# Patient Record
Sex: Male | Born: 2000 | Race: White | Hispanic: No | Marital: Single | Smoking: Never smoker
Health system: Southern US, Community
[De-identification: ages and names within clinical notes are randomized; demographics above are authoritative.]

## PROBLEM LIST (undated history)

## (undated) DIAGNOSIS — F909 Attention-deficit hyperactivity disorder, unspecified type: Secondary | ICD-10-CM

## (undated) DIAGNOSIS — R1115 Cyclical vomiting syndrome unrelated to migraine: Secondary | ICD-10-CM

## (undated) DIAGNOSIS — G43D0 Abdominal migraine, not intractable: Secondary | ICD-10-CM

## (undated) DIAGNOSIS — T7840XA Allergy, unspecified, initial encounter: Secondary | ICD-10-CM

---
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
## 2021-06-03 ENCOUNTER — Other Ambulatory Visit: Payer: Self-pay

## 2021-06-03 ENCOUNTER — Encounter: Payer: Self-pay | Admitting: Emergency Medicine

## 2021-06-03 ENCOUNTER — Ambulatory Visit
Admission: EM | Admit: 2021-06-03 | Discharge: 2021-06-03 | Disposition: A | Discharge status: Home / Self Care | Payer: BC Managed Care – PPO | Attending: Emergency Medicine | Admitting: Emergency Medicine

## 2021-06-03 DIAGNOSIS — J101 Influenza due to other identified influenza virus with other respiratory manifestations: Secondary | ICD-10-CM

## 2021-06-03 DIAGNOSIS — R112 Nausea with vomiting, unspecified: Secondary | ICD-10-CM

## 2021-06-03 LAB — POCT INFLUENZA A/B
Influenza A, POC: POSITIVE — AB
Influenza B, POC: NEGATIVE

## 2021-06-03 MED ORDER — ONDANSETRON HCL 4 MG/2ML IJ SOLN: 4.0000 mg | Freq: Once | INTRAMUSCULAR | Status: DC

## 2021-06-03 MED ORDER — OSELTAMIVIR PHOSPHATE 75 MG PO CAPS: 75.0000 mg | ORAL_CAPSULE | Freq: Two times a day (BID) | ORAL | 0 refills | Status: DC

## 2021-06-03 MED ORDER — ONDANSETRON 4 MG PO TBDP: 4.0000 mg | ORAL_TABLET | Freq: Three times a day (TID) | ORAL | 0 refills | Status: DC | PRN

## 2021-06-03 MED ORDER — ONDANSETRON 4 MG PO TBDP
4.0000 mg | ORAL_TABLET | Freq: Once | ORAL | Status: AC
  Administered 2021-06-03: 4 mg via ORAL

## 2021-06-03 NOTE — Discharge Instructions (Addendum)
Take the Tamiflu as directed.  Take Tylenol or ibuprofen as needed for fever or discomfort.    Take the antinausea medication as directed.    Keep yourself hydrated with clear liquids, such as water, Gatorade, Pedialyte, Sprite, or ginger ale.    Follow up with your primary care provider if your symptoms are not improving.

## 2021-06-03 NOTE — ED Provider Notes (Signed)
Christopher Myers    CSN: [id number] Arrival date & time: 06/03/21  1157      History   Chief Complaint Chief Complaint  Patient presents with   Fever   Emesis   Generalized Body Aches    HPI Christopher Myers is a 20 y.o. male.  Patient presents with fever, body aches, nausea, vomiting since last night.  Treatment at home with ibuprofen; last dose taken this morning.  He denies cough, shortness of breath, diarrhea, or other symptoms.  His medical history includes abdominal migraines, cyclical vomiting syndrome, allergies, ADHD.  The history is provided by the patient, a parent and medical records.   Past Medical History:  Diagnosis Date   Abdominal migraine    ADHD (attention deficit hyperactivity disorder)    Allergy    Cyclic vomiting syndrome     There are no problems to display for this patient.   Past Surgical History:  Procedure Laterality Date   FOREARM FRACTURE SURGERY Right 11/2015   HARDWARE REMOVAL Right 07/23/2016   Procedure: RIGHT FOREARM REMOVAL OF DEEP IMPLANTS RADIUS AND ULNAR;  Surgeon: Bradly Bienenstock, MD;  Location: [facility];  Service: Orthopedics;  Laterality: Right;       Home Medications    Prior to Admission medications   Medication Sig Start Date End Date Taking? Authorizing Provider  ondansetron (ZOFRAN ODT) 4 MG disintegrating tablet Take 1 tablet (4 mg total) by mouth every 8 (eight) hours as needed for nausea or vomiting. 06/03/21  Yes Mickie Bail, NP  oseltamivir (TAMIFLU) 75 MG capsule Take 1 capsule (75 mg total) by mouth every 12 (twelve) hours. 06/03/21  Yes Mickie Bail, NP  cholecalciferol (VITAMIN D) 1000 units tablet Take 1,000 Units by mouth daily.    [provider]  Coenzyme Q10 (COQ10) 200 MG CAPS Take 200 mg by mouth daily.    [provider]  Dexmethylphenidate HCl 30 MG CP24 Take 30 mg by mouth daily.  05/11/16   [provider]  ipratropium (ATROVENT) 0.06 % nasal spray Place 1 spray into  both nostrils daily. 06/30/16   [provider]  levocetirizine (XYZAL) 5 MG tablet Take 5 mg by mouth every evening.  05/12/16   [provider]  Methylphenidate HCl (CONCERTA PO) Take 30 mg by mouth.    [provider]  montelukast (SINGULAIR) 10 MG tablet Take 10 mg by mouth at bedtime.    [provider]  OVER THE COUNTER MEDICATION Take 1 tablet by mouth 2 (two) times daily. L-Cartine    [provider]    Family History Family History  Problem Relation Age of Onset   Migraines Mother     Social History Social History   Tobacco Use   Smoking status: Never   Smokeless tobacco: Never     Allergies   Fish allergy   Review of Systems Review of Systems  Constitutional:  Positive for fever. Negative for chills.  HENT:  Negative for ear pain and sore throat.   Respiratory:  Negative for cough and shortness of breath.   Cardiovascular:  Negative for chest pain and palpitations.  Gastrointestinal:  Positive for nausea and vomiting. Negative for abdominal pain, constipation and diarrhea.  Skin:  Negative for color change and rash.  All other systems reviewed and are negative.   Physical Exam Triage Vital Signs ED Triage Vitals [06/03/21 0859]  Enc Vitals Group     BP      Pulse  Resp      Temp      Temp src      SpO2      Weight      Height      Head Circumference      Peak Flow      Pain Score 7     Pain Loc      Pain Edu?      Excl. in GC?    No data found.  Updated Vital Signs BP 118/65   Pulse 78   Temp 99.3 F (37.4 C) (Oral)   Resp 20   SpO2 100%   Visual Acuity Right Eye Distance:   Left Eye Distance:   Bilateral Distance:    Right Eye Near:   Left Eye Near:    Bilateral Near:     Physical Exam Vitals and nursing note reviewed.  Constitutional:      General: He is not in acute distress.    Appearance: He is well-developed.  HENT:     Head: Normocephalic and atraumatic.     Right Ear:  Tympanic membrane normal.     Left Ear: Tympanic membrane normal.     Nose: Nose normal.     Mouth/Throat:     Mouth: Mucous membranes are moist.     Pharynx: Oropharynx is clear.  Eyes:     Conjunctiva/sclera: Conjunctivae normal.  Cardiovascular:     Rate and Rhythm: Normal rate and regular rhythm.     Heart sounds: Normal heart sounds.  Pulmonary:     Effort: Pulmonary effort is normal. No respiratory distress.     Breath sounds: Normal breath sounds.  Abdominal:     General: Bowel sounds are normal.     Palpations: Abdomen is soft.     Tenderness: There is no abdominal tenderness. There is no guarding or rebound.  Musculoskeletal:     Cervical back: Neck supple.  Skin:    General: Skin is warm and dry.  Neurological:     Mental Status: He is alert.  Psychiatric:        Mood and Affect: Mood normal.        Behavior: Behavior normal.     UC Treatments / Results  Labs (all labs ordered are listed, but only abnormal results are displayed) Labs Reviewed  POCT INFLUENZA A/B - Abnormal; Notable for the following components:      Result Value   Influenza A, POC Positive (*)    All other components within normal limits    EKG   Radiology No results found.  Procedures Procedures (including critical care time)  Medications Ordered in UC Medications  ondansetron (ZOFRAN-ODT) disintegrating tablet 4 mg (4 mg Oral Given 06/03/21 0913)    Initial Impression / Assessment and Plan / UC Course  I have reviewed the triage vital signs and the nursing notes.  Pertinent labs & imaging results that were available during my care of the patient were reviewed by me and considered in my medical decision making (see chart for details).   Influenza A. Nausea and vomiting.   Rapid flu test positive for influenza A.  Treating with Tamiflu.  Discussed symptomatic treatment including Tylenol or ibuprofen as needed.  Education provided on influenza.  Treating nausea and vomiting with  Zofran.  Instructed patient to stay hydrated with clear liquids.  Education provided on nausea and vomiting.  Instructed patient to follow-up with PCP if his symptoms are not improving.  He agrees to plan of  care.    Final Clinical Impressions(s) / UC Diagnoses   Final diagnoses:  Influenza A  Nausea and vomiting, unspecified vomiting type     Discharge Instructions      Take the Tamiflu as directed.  Take Tylenol or ibuprofen as needed for fever or discomfort.    Take the antinausea medication as directed.    Keep yourself hydrated with clear liquids, such as water, Gatorade, Pedialyte, Sprite, or ginger ale.    Follow up with your primary care provider if your symptoms are not improving.           ED Prescriptions     Medication Sig Dispense Auth. Provider   ondansetron (ZOFRAN ODT) 4 MG disintegrating tablet Take 1 tablet (4 mg total) by mouth every 8 (eight) hours as needed for nausea or vomiting. 20 tablet Sharion Balloon, NP   oseltamivir (TAMIFLU) 75 MG capsule Take 1 capsule (75 mg total) by mouth every 12 (twelve) hours. 10 capsule Sharion Balloon, NP      PDMP not reviewed this encounter.   Sharion Balloon, NP 06/03/21 [phone]

## 2021-06-03 NOTE — ED Triage Notes (Signed)
Pt here with fever, vomiting, and body aches since last night.

## 2021-06-09 ENCOUNTER — Encounter: Payer: Self-pay | Admitting: Emergency Medicine

## 2021-06-09 ENCOUNTER — Other Ambulatory Visit: Payer: Self-pay

## 2021-06-09 ENCOUNTER — Ambulatory Visit
Admission: EM | Admit: 2021-06-09 | Discharge: 2021-06-09 | Disposition: A | Discharge status: Home / Self Care | Payer: BC Managed Care – PPO | Attending: Emergency Medicine | Admitting: Emergency Medicine

## 2021-06-09 DIAGNOSIS — R051 Acute cough: Secondary | ICD-10-CM | POA: Diagnosis not present

## 2021-06-09 DIAGNOSIS — R058 Other specified cough: Secondary | ICD-10-CM

## 2021-06-09 MED ORDER — BENZONATATE 100 MG PO CAPS: 100.0000 mg | ORAL_CAPSULE | Freq: Three times a day (TID) | ORAL | 0 refills | Status: DC

## 2021-06-09 NOTE — Discharge Instructions (Addendum)
Take Tessalon perles as prescribed  Most people will have symptoms for about 7 - 10 days. A cough might linger for 2-3 weeks. Pay special attention to handwashing as this can prevent spread of the virus.   Always read the labels of cough and cold medications as they may contain some of the ingredients below.  Rest, push lots of fluids (especially water), and utilize supportive care for symptoms. You may take acetaminophen (Tylenol) every 4-6 hours and ibuprofen every 6-8 hours for muscle pain, joint pain, headaches (you may also alternate these medications). Mucinex (guaifenesin) may be taken over the counter for cough as needed can loosen phlegm. Please read the instructions and take as directed.  Saline nasal sprays or rinses can also help nasal congestion (use bottled or sterile water). Warm tea with lemon and honey can sooth sore throat and cough, as can cough drops.   Return to clinic for high fever not improving with medications, chest pain, difficulty breathing, non-stop vomiting, or coughing blood. Follow-up with your primary care provider if symptoms do not improve as expected in the next 5-7 days.

## 2021-06-09 NOTE — ED Triage Notes (Signed)
Pt here flu positive with persistent cough x 1 week.

## 2021-06-09 NOTE — ED Provider Notes (Signed)
CHIEF COMPLAINT:   Chief Complaint  Patient presents with   Cough   Influenza     SUBJECTIVE/HPI:   Cough Influenza Presenting symptoms: cough   A very pleasant 20 y.o.Male presents today with influenza and continued cough x 1 week. Patient does not report any shortness of breath, chest pain, palpitations, visual changes, weakness, tingling, headache, nausea, vomiting, diarrhea, fever, chills.   has a past medical history of Abdominal migraine, ADHD (attention deficit hyperactivity disorder), Allergy, and Cyclic vomiting syndrome.  ROS:  Review of Systems  Respiratory:  Positive for cough.   See Subjective/HPI Medications, Allergies and Problem List personally reviewed in Epic today OBJECTIVE:   Vitals:   06/09/21 0814  BP: 134/82  Pulse: 68  Resp: 20  Temp: 98.9 F (37.2 C)  SpO2: 98%    Physical Exam   General: Appears well-developed and well-nourished. No acute distress.  HEENT Head: Normocephalic and atraumatic.   Ears: Hearing grossly intact, no drainage or visible deformity.  Nose: No nasal deviation.   Mouth/Throat: No stridor or tracheal deviation.  Non erythematous posterior pharynx noted with clear drainage present.  No white patchy exudate noted. Eyes: Conjunctivae and EOM are normal. No eye drainage or scleral icterus bilaterally.  Neck: Normal range of motion, neck is supple.  Cardiovascular: Normal rate. Regular rhythm; no murmurs, gallops, or rubs.  Pulm/Chest: No respiratory distress. Breath sounds normal bilaterally without wheezes, rhonchi, or rales.  Neurological: Alert and oriented to person, place, and time.  Skin: Skin is warm and dry.  No rashes, lesions, abrasions or bruising noted to skin.   Psychiatric: Normal mood, affect, behavior, and thought content.   Vital signs and nursing note reviewed.   Patient stable and cooperative with examination. PROCEDURES:    LABS/X-RAYS/EKG/MEDS:   No results found for any visits on  06/09/21.  MEDICAL DECISION MAKING:   Patient presents with influenza and continued cough x 1 week. Patient does not report any shortness of breath, chest pain, palpitations, visual changes, weakness, tingling, headache, nausea, vomiting, diarrhea, fever, chills. Likely post-viral cough. Rx [employer]. Advised rest, fluids, Tylenol, ibuprofen.  Follow-up with PCP if not improving over the next 5 to 7 days.  Return for any new high fever not improved with medications, chest pain, difficulty breathing, nonstop vomiting or coughing up blood.   Patient verbalized understanding and agreed with treatment plan.  Patient stable upon discharge. ASSESSMENT/PLAN:  1. Post-viral cough syndrome - benzonatate (TESSALON) 100 MG capsule; Take 1 capsule (100 mg total) by mouth every 8 (eight) hours.  Dispense: 21 capsule; Refill: 0 Instructions about new medications and side effects provided.  Plan:   Discharge Instructions      Take Tessalon perles as prescribed  Most people will have symptoms for about 7 - 10 days. A cough might linger for 2-3 weeks. Pay special attention to handwashing as this can prevent spread of the virus.   Always read the labels of cough and cold medications as they may contain some of the ingredients below.  Rest, push lots of fluids (especially water), and utilize supportive care for symptoms. You may take acetaminophen (Tylenol) every 4-6 hours and ibuprofen every 6-8 hours for muscle pain, joint pain, headaches (you may also alternate these medications). Mucinex (guaifenesin) may be taken over the counter for cough as needed can loosen phlegm. Please read the instructions and take as directed.  Saline nasal sprays or rinses can also help nasal congestion (use bottled or sterile water). Warm tea with  lemon and honey can sooth sore throat and cough, as can cough drops.   Return to clinic for high fever not improving with medications, chest pain, difficulty breathing,  non-stop vomiting, or coughing blood. Follow-up with your primary care provider if symptoms do not improve as expected in the next 5-7 days.          Amalia Greenhouse, [place] 06/09/21 [phone]

## 2021-07-07 ENCOUNTER — Ambulatory Visit
Admission: RE | Admit: 2021-07-07 | Discharge: 2021-07-07 | Disposition: A | Discharge status: Home / Self Care | Payer: BC Managed Care – PPO | Source: Ambulatory Visit | Attending: Medical Oncology | Admitting: Medical Oncology

## 2021-07-07 ENCOUNTER — Other Ambulatory Visit: Payer: Self-pay

## 2021-07-07 ENCOUNTER — Telehealth: Payer: Self-pay | Admitting: Medical Oncology

## 2021-07-07 VITALS — BP 112/71 | HR 102 | Temp 98.7°F | Resp 16

## 2021-07-07 DIAGNOSIS — B349 Viral infection, unspecified: Secondary | ICD-10-CM | POA: Insufficient documentation

## 2021-07-07 DIAGNOSIS — R058 Other specified cough: Secondary | ICD-10-CM

## 2021-07-07 DIAGNOSIS — J029 Acute pharyngitis, unspecified: Secondary | ICD-10-CM | POA: Insufficient documentation

## 2021-07-07 LAB — POCT RAPID STREP A (OFFICE): Rapid Strep A Screen: NEGATIVE

## 2021-07-07 MED ORDER — BENZONATATE 100 MG PO CAPS: 100.0000 mg | ORAL_CAPSULE | Freq: Three times a day (TID) | ORAL | 0 refills | Status: DC

## 2021-07-07 NOTE — ED Triage Notes (Signed)
Pt here with sore throat, nasal congestion, fever at home, and slight cough x 2 days. Pt works at [employer].

## 2021-07-07 NOTE — Telephone Encounter (Signed)
Error

## 2021-07-07 NOTE — ED Provider Notes (Signed)
[facility]    CSN: [id number] Arrival date & time: 07/07/21  1154      History   Chief Complaint Cold Symptoms    HPI Christopher Myers is a 20 y.o. male. Pt presents with his mother  HPI  Cold Symptoms: Pt states that he has had cold like symptoms of nasal congestion, sore throat, fever and mild cough for the past 2 days. He reports that one month ago he had the flu but fully resolved. 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 He denies SOB, wheezing, vomiting. He has tried tylenol, sore throat lozenges and sprays for symptoms with some relief.   Past Medical History:  Diagnosis Date   Abdominal migraine    ADHD (attention deficit hyperactivity disorder)    Allergy    Cyclic vomiting syndrome     There are no problems to display for this patient.   Past Surgical History:  Procedure Laterality Date   FOREARM FRACTURE SURGERY Right 11/2015   HARDWARE REMOVAL Right 07/23/2016   Procedure: RIGHT FOREARM REMOVAL OF DEEP IMPLANTS RADIUS AND ULNAR;  Surgeon: Iran Planas, MD;  Location: [facility];  Service: Orthopedics;  Laterality: Right;       Home Medications    Prior to Admission medications   Medication Sig Start Date End Date Taking? Authorizing Provider  benzonatate (TESSALON) 100 MG capsule Take 1 capsule (100 mg total) by mouth every 8 (eight) hours. 06/09/21   Boddu, Erasmo Downer, FNP  cholecalciferol (VITAMIN D) 1000 units tablet Take 1,000 Units by mouth daily.    [provider]  Coenzyme Q10 (COQ10) 200 MG CAPS Take 200 mg by mouth daily.    [provider]  Dexmethylphenidate HCl 30 MG CP24 Take 30 mg by mouth daily.  05/11/16   [provider]  ipratropium (ATROVENT) 0.06 % nasal spray Place 1 spray into both nostrils daily. 06/30/16   [provider]  levocetirizine (XYZAL) 5 MG tablet Take 5 mg by mouth every evening.  05/12/16   [provider]  Methylphenidate  HCl (CONCERTA PO) Take 30 mg by mouth.    [provider]  montelukast (SINGULAIR) 10 MG tablet Take 10 mg by mouth at bedtime.    [provider]  ondansetron (ZOFRAN ODT) 4 MG disintegrating tablet Take 1 tablet (4 mg total) by mouth every 8 (eight) hours as needed for nausea or vomiting. 06/03/21   Sharion Balloon, NP  oseltamivir (TAMIFLU) 75 MG capsule Take 1 capsule (75 mg total) by mouth every 12 (twelve) hours. 06/03/21   Sharion Balloon, NP  OVER THE COUNTER MEDICATION Take 1 tablet by mouth 2 (two) times daily. L-Cartine    [provider]    Family History Family History  Problem Relation Age of Onset   Migraines Mother     Social History Social History   Tobacco Use   Smoking status: Never   Smokeless tobacco: Never     Allergies   Fish allergy   Review of Systems Review of Systems  As stated above in HPI Physical Exam Triage Vital Signs ED Triage Vitals  Enc Vitals Group     BP      Pulse      Resp      Temp      Temp src      SpO2      Weight      Height  Head Circumference      Peak Flow      Pain Score      Pain Loc      Pain Edu?      Excl. in GC?    No data found.  Updated Vital Signs BP 112/71    Pulse (!) 102    Temp 98.7 F (37.1 C) (Oral)    Resp 16    SpO2 100%   Physical Exam Vitals and nursing note reviewed.  Constitutional:      General: He is not in acute distress.    Appearance: Normal appearance. He is not ill-appearing, toxic-appearing or diaphoretic.  HENT:     Head: Normocephalic and atraumatic.     Right Ear: Tympanic membrane normal.     Left Ear: Tympanic membrane normal.     Nose: Nose normal.     Mouth/Throat:     Mouth: Mucous membranes are moist.     Pharynx: Posterior oropharyngeal erythema present. No oropharyngeal exudate.  Eyes:     Extraocular Movements: Extraocular movements intact.     Pupils: Pupils are equal, round, and reactive to light.  Cardiovascular:     Rate and  Rhythm: Normal rate and regular rhythm.     Heart sounds: Normal heart sounds.  Pulmonary:     Effort: Pulmonary effort is normal.     Breath sounds: Normal breath sounds.  Abdominal:     Palpations: Abdomen is soft.  Musculoskeletal:     Cervical back: Normal range of motion and neck supple.  Lymphadenopathy:     Cervical: No cervical adenopathy.  Skin:    General: Skin is warm.     Findings: No rash.  Neurological:     Mental Status: He is alert and oriented to person, place, and time.     UC Treatments / Results  Labs (all labs ordered are listed, but only abnormal results are displayed) Labs Reviewed  NOVEL CORONAVIRUS, NAA  POCT RAPID STREP A (OFFICE)    EKG   Radiology No results found.  Procedures Procedures (including critical care time)  Medications Ordered in UC Medications - No data to display  Initial Impression / Assessment and Plan / UC Course  I have reviewed the triage vital signs and the nursing notes.  Pertinent labs & imaging results that were available during my care of the patient were reviewed by me and considered in my medical decision making (see chart for details).     New. Likely viral in nature. Rapid strep is negative. COVID-19 testing pending. For now I have recommended tylenol or motrin, fluids, rest, sore throat lozenges/sprays and tessalon which I have sent to the pharmacy for him. Discussed red flag signs and symptoms. Follow up PRN.  Final Clinical Impressions(s) / UC Diagnoses   Final diagnoses:  Viral illness   Discharge Instructions   None    ED Prescriptions   None    PDMP not reviewed this encounter.   Rushie Chestnut, [place] 07/07/21 1250

## 2021-07-08 LAB — SARS-COV-2, NAA 2 DAY TAT

## 2021-07-08 LAB — NOVEL CORONAVIRUS, NAA: SARS-CoV-2, NAA: NOT DETECTED

## 2021-07-09 LAB — CULTURE, GROUP A STREP (THRC)

## 2021-09-14 ENCOUNTER — Telehealth: Payer: BC Managed Care – PPO | Admitting: Physician Assistant

## 2021-09-14 DIAGNOSIS — U071 COVID-19: Secondary | ICD-10-CM

## 2021-09-14 MED ORDER — NIRMATRELVIR/RITONAVIR (PAXLOVID)TABLET
3.0000 | ORAL_TABLET | Freq: Two times a day (BID) | ORAL | 0 refills | Status: AC
Start: 2021-09-14 — End: 2021-09-19

## 2021-09-14 NOTE — Progress Notes (Signed)
Virtual Visit Consent   Christopher Myers, you are scheduled for a virtual visit with a [facility] provider today.     Just as with appointments in the office, your consent must be obtained to participate.  Your consent will be active for this visit and any virtual visit you may have with one of our providers in the next 365 days.     If you have a MyChart account, a copy of this consent can be sent to you electronically.  All virtual visits are billed to your insurance company just like a traditional visit in the office.    As this is a virtual visit, video technology does not allow for your provider to perform a traditional examination.  This may limit your provider's ability to fully assess your condition.  If your provider identifies any concerns that need to be evaluated in person or the need to arrange testing (such as labs, EKG, etc.), we will make arrangements to do so.     Although advances in technology are sophisticated, we cannot ensure that it will always work on either your end or our end.  If the connection with a video visit is poor, the visit may have to be switched to a telephone visit.  With either a video or telephone visit, we are not always able to ensure that we have a secure connection.     I need to obtain your verbal consent now.   Are you willing to proceed with your visit today?    Christopher Myers has provided verbal consent on 09/14/2021 for a virtual visit (video or telephone).   Mar Daring, PA-C   Date: 09/14/2021 1:44 PM   Virtual Visit via Video Note   IMar Daring, connected with  Christopher Myers  ([id number], 2000/08/18) on 09/14/21 at  1:30 PM EST by a video-enabled telemedicine application and verified that I am speaking with the correct person using two identifiers.  Location: Patient: Virtual Visit Location Patient: Home Provider: Virtual Visit Location Provider: Home Office   I discussed the limitations of evaluation and  management by telemedicine and the availability of in person appointments. The patient expressed understanding and agreed to proceed.    History of Present Illness: Christopher Myers is a 21 y.o. who identifies as a male who was assigned male at birth, and is being seen today for Covid 39.  HPI: URI  This is a new problem. Episode onset: tested positive for covid 19 today; symptoms started last night. The problem has been gradually worsening. There has been no fever. Associated symptoms include congestion, coughing, headaches, rhinorrhea, sinus pain and a sore throat. Pertinent negatives include no diarrhea, ear pain, nausea, plugged ear sensation or vomiting. Associated symptoms comments: Lightheaded, fatigue. Treatments tried: ibuprofen. The treatment provided mild relief.     Problems: There are no problems to display for this patient.   Allergies:  Allergies  Allergen Reactions   Fish Allergy Other (See Comments)    unknown   Medications:  Current Outpatient Medications:    nirmatrelvir/ritonavir EUA (PAXLOVID) 20 x 150 MG & 10 x 100MG  TABS, Take 3 tablets by mouth 2 (two) times daily for 5 days. (Take nirmatrelvir 150 mg two tablets twice daily for 5 days and ritonavir 100 mg one tablet twice daily for 5 days), Disp: 30 tablet, Rfl: 0   benzonatate (TESSALON) 100 MG capsule, Take 1 capsule (100 mg total) by mouth every 8 (eight) hours., Disp: 21  capsule, Rfl: 0   cholecalciferol (VITAMIN D) 1000 units tablet, Take 1,000 Units by mouth daily., Disp: , Rfl:    Coenzyme Q10 (COQ10) 200 MG CAPS, Take 200 mg by mouth daily., Disp: , Rfl:    Dexmethylphenidate HCl 30 MG CP24, Take 30 mg by mouth daily. , Disp: , Rfl:    ipratropium (ATROVENT) 0.06 % nasal spray, Place 1 spray into both nostrils daily., Disp: , Rfl:    levocetirizine (XYZAL) 5 MG tablet, Take 5 mg by mouth every evening. , Disp: , Rfl:    Methylphenidate HCl (CONCERTA PO), Take 30 mg by mouth., Disp: , Rfl:    montelukast  (SINGULAIR) 10 MG tablet, Take 10 mg by mouth at bedtime., Disp: , Rfl:    ondansetron (ZOFRAN ODT) 4 MG disintegrating tablet, Take 1 tablet (4 mg total) by mouth every 8 (eight) hours as needed for nausea or vomiting., Disp: 20 tablet, Rfl: 0   oseltamivir (TAMIFLU) 75 MG capsule, Take 1 capsule (75 mg total) by mouth every 12 (twelve) hours., Disp: 10 capsule, Rfl: 0   OVER THE COUNTER MEDICATION, Take 1 tablet by mouth 2 (two) times daily. L-Cartine, Disp: , Rfl:   Observations/Objective: Patient is well-developed, well-nourished in no acute distress.  Resting comfortably at home.  Head is normocephalic, atraumatic.  No labored breathing.  Speech is clear and coherent with logical content.  Patient is alert and oriented at baseline.    Assessment and Plan: 1. COVID-19 - nirmatrelvir/ritonavir EUA (PAXLOVID) 20 x 150 MG & 10 x 100MG  TABS; Take 3 tablets by mouth 2 (two) times daily for 5 days. (Take nirmatrelvir 150 mg two tablets twice daily for 5 days and ritonavir 100 mg one tablet twice daily for 5 days)  Dispense: 30 tablet; Refill: 0  - Continue OTC symptomatic management of choice - Will send OTC vitamins and supplement information through AVS - Paxlovid prescribed; discussed all risks including kidney and liver injury and patient still desires to proceed despite not having labs - Patient enrolled in MyChart symptom monitoring - Push fluids - Rest as needed - Discussed return precautions and when to seek in-person evaluation, sent via AVS as well  Follow Up Instructions: I discussed the assessment and treatment plan with the patient. The patient was provided an opportunity to ask questions and all were answered. The patient agreed with the plan and demonstrated an understanding of the instructions.  A copy of instructions were sent to the patient via MyChart unless otherwise noted below.    The patient was advised to call back or seek an in-person evaluation if the symptoms  worsen or if the condition fails to improve as anticipated.  Time:  I spent 15 minutes with the patient via telehealth technology discussing the above problems/concerns.    Mar Daring, PA-C

## 2021-09-14 NOTE — Patient Instructions (Signed)
Jackelyn Poling, thank you for joining Margaretann Loveless, PA-C for today's virtual visit.  While this provider is not your primary care provider (PCP), if your PCP is located in our provider database this encounter information will be shared with them immediately following your visit.  Consent: (Patient) Christopher Myers provided verbal consent for this virtual visit at the beginning of the encounter.  Current Medications:  Current Outpatient Medications:    nirmatrelvir/ritonavir EUA (PAXLOVID) 20 x 150 MG & 10 x 100MG  TABS, Take 3 tablets by mouth 2 (two) times daily for 5 days. (Take nirmatrelvir 150 mg two tablets twice daily for 5 days and ritonavir 100 mg one tablet twice daily for 5 days), Disp: 30 tablet, Rfl: 0   benzonatate (TESSALON) 100 MG capsule, Take 1 capsule (100 mg total) by mouth every 8 (eight) hours., Disp: 21 capsule, Rfl: 0   cholecalciferol (VITAMIN D) 1000 units tablet, Take 1,000 Units by mouth daily., Disp: , Rfl:    Coenzyme Q10 (COQ10) 200 MG CAPS, Take 200 mg by mouth daily., Disp: , Rfl:    Dexmethylphenidate HCl 30 MG CP24, Take 30 mg by mouth daily. , Disp: , Rfl:    ipratropium (ATROVENT) 0.06 % nasal spray, Place 1 spray into both nostrils daily., Disp: , Rfl:    levocetirizine (XYZAL) 5 MG tablet, Take 5 mg by mouth every evening. , Disp: , Rfl:    Methylphenidate HCl (CONCERTA PO), Take 30 mg by mouth., Disp: , Rfl:    montelukast (SINGULAIR) 10 MG tablet, Take 10 mg by mouth at bedtime., Disp: , Rfl:    ondansetron (ZOFRAN ODT) 4 MG disintegrating tablet, Take 1 tablet (4 mg total) by mouth every 8 (eight) hours as needed for nausea or vomiting., Disp: 20 tablet, Rfl: 0   oseltamivir (TAMIFLU) 75 MG capsule, Take 1 capsule (75 mg total) by mouth every 12 (twelve) hours., Disp: 10 capsule, Rfl: 0   OVER THE COUNTER MEDICATION, Take 1 tablet by mouth 2 (two) times daily. L-Cartine, Disp: , Rfl:    Medications ordered in this encounter:  Meds ordered this  encounter  Medications   nirmatrelvir/ritonavir EUA (PAXLOVID) 20 x 150 MG & 10 x 100MG  TABS    Sig: Take 3 tablets by mouth 2 (two) times daily for 5 days. (Take nirmatrelvir 150 mg two tablets twice daily for 5 days and ritonavir 100 mg one tablet twice daily for 5 days)    Dispense:  30 tablet    Refill:  0    Order Specific Question:   Supervising Provider    Answer:   Eber Hong [[id number]]     *If you need refills on other medications prior to your next appointment, please contact your pharmacy*  Follow-Up: Call back or seek an in-person evaluation if the symptoms worsen or if the condition fails to improve as anticipated.  Other Instructions Paxlovid- Nirmatrelvir; Ritonavir Tablets What is this medication? NIRMATRELVIR; RITONAVIR (NIR ma TREL vir; ri TOE na veer) treats mild to moderate COVID-19. It may help people who are at high risk of developing severe illness. This medication works by limiting the spread of the virus in your body. The FDA has allowed the emergency use of this medication. This medicine may be used for other purposes; ask your health care provider or pharmacist if you have questions. COMMON BRAND NAME(S): PAXLOVID What should I tell my care team before I take this medication? They need to know if you have any of these  conditions: Any allergies Any serious illness Kidney disease Liver disease An unusual or allergic reaction to nirmatrelvir, ritonavir, other medications, foods, dyes, or preservatives Pregnant or trying to get pregnant Breast-feeding How should I use this medication? This product contains 2 different medications that are packaged together. For the standard dose, take 2 pink tablets of nirmatrelvir with 1 white tablet of ritonavir (3 tablets total) by mouth with water twice daily. Talk to your care team if you have kidney disease. You may need a different dose. Swallow the tablets whole. You can take it with or without food. If it upsets your  stomach, take it with food. Take all of this medication unless your care team tells you to stop it early. Keep taking it even if you think you are better. Talk to your care team about the use of this medication in children. While it may be prescribed for children as young as 12 years for selected conditions, precautions do apply. Overdosage: If you think you have taken too much of this medicine contact a poison control center or emergency room at once. NOTE: This medicine is only for you. Do not share this medicine with others. What if I miss a dose? If you miss a dose, take it as soon as you can unless it is more than 8 hours late. If it is more than 8 hours late, skip the missed dose. Take the next dose at the normal time. Do not take extra or 2 doses at the same time to make up for the missed dose. What may interact with this medication? Do not take this medication with any of the following medications: Alfuzosin Certain medications for anxiety or sleep like midazolam, triazolam Certain medications for cancer like apalutamide, enzalutamide Certain medications for cholesterol like lovastatin, simvastatin Certain medications for irregular heart beat like amiodarone, dronedarone, flecainide, propafenone, quinidine Certain medications for pain like meperidine, piroxicam Certain medications for psychotic disorders like clozapine, lurasidone, pimozide Certain medications for seizures like carbamazepine, phenobarbital, phenytoin Colchicine Eletriptan Eplerenone Ergot alkaloids like dihydroergotamine, ergonovine, ergotamine, methylergonovine Finerenone Flibanserin Ivabradine Lomitapide Naloxegol Ranolazine Rifampin Sildenafil Silodosin St. John's Wort Tolvaptan Ubrogepant Voclosporin This medication may also interact with the following medications: Bedaquiline Birth control pills Bosentan Certain antibiotics like erythromycin or clarithromycin Certain medications for blood pressure  like amlodipine, diltiazem, felodipine, nicardipine, nifedipine Certain medications for cancer like abemaciclib, ceritinib, dasatinib, encorafenib, ibrutinib, ivosidenib, neratinib, nilotinib, venetoclax, vinblastine, vincristine Certain medications for cholesterol like atorvastatin, rosuvastatin Certain medications for depression like bupropion, trazodone Certain medications for fungal infections like isavuconazonium, itraconazole, ketoconazole, voriconazole Certain medications for hepatitis C like elbasvir; grazoprevir, dasabuvir; ombitasvir; paritaprevir; ritonavir, glecaprevir; pibrentasvir, sofosbuvir; velpatasvir; voxilaprevir Certain medications for HIV or AIDS Certain medications for irregular heartbeat like lidocaine Certain medications that treat or prevent blood clots like rivaroxaban, warfarin Digoxin Fentanyl Medications that lower your chance of fighting infection like cyclosporine, sirolimus, tacrolimus Methadone Quetiapine Rifabutin Salmeterol Steroid medications like betamethasone, budesonide, ciclesonide, dexamethasone, fluticasone, methylprednisone, mometasone, triamcinolone This list may not describe all possible interactions. Give your health care provider a list of all the medicines, herbs, non-prescription drugs, or dietary supplements you use. Also tell them if you smoke, drink alcohol, or use illegal drugs. Some items may interact with your medicine. What should I watch for while using this medication? Your condition will be monitored carefully while you are receiving this medication. Visit your care team for regular checkups. Tell your care team if your symptoms do not start to get better or if they  get worse. If you have untreated HIV infection, this medication may lead to some HIV medications not working as well in the future. Birth control may not work properly while you are taking this medication. Talk to your care team about using an extra method of birth  control. What side effects may I notice from receiving this medication? Side effects that you should report to your care team as soon as possible: Allergic reactions--skin rash, itching, hives, swelling of the face, lips, tongue, or throat Liver injury--right upper belly pain, loss of appetite, nausea, light-colored stool, dark yellow or brown urine, yellowing skin or eyes, unusual weakness or fatigue Redness, blistering, peeling, or loosening of the skin, including inside the mouth Side effects that usually do not require medical attention (report these to your care team if they continue or are bothersome): Change in taste Diarrhea General discomfort and fatigue Increase in blood pressure Muscle pain Nausea Stomach pain This list may not describe all possible side effects. Call your doctor for medical advice about side effects. You may report side effects to FDA at 1-800-FDA-1088. Where should I keep my medication? Keep out of the reach of children and pets. Store at room temperature between 20 and 25 degrees C (68 and 77 degrees F). Get rid of any unused medication after the expiration date. To get rid of medications that are no longer needed or have expired: Take the medication to a medication take-back program. Check with your pharmacy or law enforcement to find a location. If you cannot return the medication, check the label or package insert to see if the medication should be thrown out in the garbage or flushed down the toilet. If you are not sure, ask your care team. If it is safe to put it in the trash, take the medication out of the container. Mix the medication with cat litter, dirt, coffee grounds, or other unwanted substance. Seal the mixture in a bag or container. Put it in the trash. NOTE: This sheet is a summary. It may not cover all possible information. If you have questions about this medicine, talk to your doctor, pharmacist, or health care provider.  2022 [employer]  Standard (2021-04-12 00:00:00)    If you have been instructed to have an in-person evaluation today at a local Urgent Care facility, please use the link below. It will take you to a list of all of our available [facility], including address, phone number and hours of operation. Please do not delay care.  [facility]  If you or a family member do not have a primary care provider, use the link below to schedule a visit and establish care. When you choose a [facility] primary care physician or advanced practice provider, you gain a long-term partner in health. Find a Primary Care Provider  Learn more about [facility]'s in-office and virtual care options: [place] - Get Care Now

## 2022-04-02 ENCOUNTER — Ambulatory Visit
Admission: RE | Admit: 2022-04-02 | Discharge: 2022-04-02 | Disposition: A | Discharge status: Home / Self Care | Payer: BC Managed Care – PPO | Source: Ambulatory Visit | Attending: Emergency Medicine | Admitting: Emergency Medicine

## 2022-04-02 VITALS — BP 115/73 | HR 56 | Temp 98.1°F | Resp 18

## 2022-04-02 DIAGNOSIS — B349 Viral infection, unspecified: Secondary | ICD-10-CM | POA: Diagnosis not present

## 2022-04-02 DIAGNOSIS — R197 Diarrhea, unspecified: Secondary | ICD-10-CM

## 2022-04-02 DIAGNOSIS — R110 Nausea: Secondary | ICD-10-CM | POA: Diagnosis not present

## 2022-04-02 MED ORDER — ONDANSETRON 4 MG PO TBDP: 4.0000 mg | ORAL_TABLET | Freq: Three times a day (TID) | ORAL | 0 refills | Status: AC | PRN

## 2022-04-02 NOTE — ED Provider Notes (Signed)
Christopher Myers    CSN: [id number] Arrival date & time: 04/02/22  1238      History   Chief Complaint Chief Complaint  Patient presents with   Diarrhea    Headache, pressure in my head - Entered by patient   Headache    HPI Christopher Myers is a 21 y.o. male.  Accompanied by his mother, patient presents with 2-day history of headache, dizziness, abdominal pain, nausea, diarrhea.  No fever, rash, ear pain, sore throat, cough, shortness of breath, chest pain, vomiting, dysuria, or other symptoms.  Negative COVID test at home yesterday.  He has been keeping himself hydrated at home with water.  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  His medical history includes abdominal migraine, cyclic vomiting syndrome, ADHD, allergies.  The history is provided by the patient, a parent and medical records.    Past Medical History:  Diagnosis Date   Abdominal migraine    ADHD (attention deficit hyperactivity disorder)    Allergy    Cyclic vomiting syndrome     There are no problems to display for this patient.   Past Surgical History:  Procedure Laterality Date   FOREARM FRACTURE SURGERY Right 11/2015   HARDWARE REMOVAL Right 07/23/2016   Procedure: RIGHT FOREARM REMOVAL OF DEEP IMPLANTS RADIUS AND ULNAR;  Surgeon: Bradly Bienenstock, MD;  Location: [facility] OR;  Service: Orthopedics;  Laterality: Right;       Home Medications    Prior to Admission medications   Medication Sig Start Date End Date Taking? Authorizing Provider  ondansetron (ZOFRAN-ODT) 4 MG disintegrating tablet Take 1 tablet (4 mg total) by mouth every 8 (eight) hours as needed for nausea or vomiting. 04/02/22  Yes Mickie Bail, NP  benzonatate (TESSALON) 100 MG capsule Take 1 capsule (100 mg total) by mouth every 8 (eight) hours. 07/07/21   Rushie Chestnut, PA-C  cholecalciferol (VITAMIN D) 1000 units tablet Take 1,000 Units by mouth daily.    [provider]  Coenzyme Q10 (COQ10) 200 MG CAPS Take 200 mg by mouth daily.    [provider]  Dexmethylphenidate HCl 30 MG CP24 Take 30 mg by mouth daily.  05/11/16   [provider]  ipratropium (ATROVENT) 0.06 % nasal spray Place 1 spray into both nostrils daily. 06/30/16   [provider]  levocetirizine (XYZAL) 5 MG tablet Take 5 mg by mouth every evening.  05/12/16   [provider]  Methylphenidate HCl (CONCERTA PO) Take 30 mg by mouth.    [provider]  montelukast (SINGULAIR) 10 MG tablet Take 10 mg by mouth at bedtime.    [provider]  oseltamivir (TAMIFLU) 75 MG capsule Take 1 capsule (75 mg total) by mouth every 12 (twelve) hours. 06/03/21   Mickie Bail, NP  OVER THE COUNTER MEDICATION Take 1 tablet by mouth 2 (two) times daily. L-Cartine    [provider]    Family History Family History  Problem Relation Age of Onset   Migraines Mother     Social History Social History   Tobacco Use   Smoking status: Never   Smokeless tobacco: Never  Vaping Use   Vaping Use: Never used  Substance Use Topics   Alcohol use: Never   Drug use: Never     Allergies   Fish allergy   Review of Systems Review of Systems  Constitutional:  Negative for  chills and fever.  HENT:  Negative for ear pain and sore throat.   Respiratory:  Negative for cough and shortness of breath.   Cardiovascular:  Negative for chest pain and palpitations.  Gastrointestinal:  Positive for abdominal pain and nausea. Negative for constipation, diarrhea and vomiting.  Genitourinary:  Negative for dysuria and hematuria.  Skin:  Negative for color change and rash.  Neurological:  Positive for dizziness and headaches.  All other systems reviewed and are negative.    Physical Exam Triage Vital Signs ED Triage Vitals  Enc Vitals Group     BP 04/02/22 1259 115/73     Pulse Rate 04/02/22 1259 (!) 56     Resp 04/02/22 1259 18     Temp  04/02/22 1259 98.1 F (36.7 C)     Temp src --      SpO2 04/02/22 1259 97 %     Weight --      Height --      Head Circumference --      Peak Flow --      Pain Score 04/02/22 1303 2     Pain Loc --      Pain Edu? --      Excl. in [place]? --    No data found.  Updated Vital Signs BP 115/73   Pulse (!) 56   Temp 98.1 F (36.7 C)   Resp 18   SpO2 97%   Visual Acuity Right Eye Distance:   Left Eye Distance:   Bilateral Distance:    Right Eye Near:   Left Eye Near:    Bilateral Near:     Physical Exam Vitals and nursing note reviewed.  Constitutional:      General: He is not in acute distress.    Appearance: Normal appearance. He is well-developed. He is not ill-appearing.  HENT:     Mouth/Throat:     Mouth: Mucous membranes are moist.     Pharynx: Oropharynx is clear.  Cardiovascular:     Rate and Rhythm: Normal rate and regular rhythm.     Heart sounds: Normal heart sounds.  Pulmonary:     Effort: Pulmonary effort is normal. No respiratory distress.     Breath sounds: Normal breath sounds.  Abdominal:     General: Bowel sounds are normal.     Palpations: Abdomen is soft.     Tenderness: There is no abdominal tenderness. There is no guarding or rebound.  Musculoskeletal:     Cervical back: Neck supple.  Skin:    General: Skin is warm and dry.  Neurological:     General: No focal deficit present.     Mental Status: He is alert and oriented to person, place, and time.     Sensory: No sensory deficit.     Motor: No weakness.     Gait: Gait normal.  Psychiatric:        Mood and Affect: Mood normal.        Behavior: Behavior normal.      UC Treatments / Results  Labs (all labs ordered are listed, but only abnormal results are displayed) Labs Reviewed - No data to display  EKG   Radiology No results found.  Procedures Procedures (including critical care time)  Medications Ordered in UC Medications - No data to display  Initial Impression /  Assessment and Plan / UC Course  I have reviewed the triage vital signs and the nursing notes.  Pertinent labs & imaging results  that were available during my care of the patient were reviewed by me and considered in my medical decision making (see chart for details).    Viral illness, nausea without vomiting, diarrhea.  Patient is well-appearing.  His exam is reassuring.  Afebrile and vital signs are stable.  He appears well-hydrated.  Abdomen is soft and nontender with good bowel sounds.  Treating nausea with Zofran.  Discussed clear liquid diet.  Instructed patient to advance to diarrhea diet as tolerated.  ED precautions discussed.  Education provided on nausea, diarrhea, viral illness.  Instructed patient to follow-up with his PCP as needed.  He agrees to plan of care.   Final Clinical Impressions(s) / UC Diagnoses   Final diagnoses:  Viral illness  Nausea without vomiting  Diarrhea, unspecified type     Discharge Instructions      Take the antinausea medication as directed.    Keep yourself hydrated with clear liquids, such as water and Gatorade.  Follow the diarrhea diet as tolerated.   Follow up with your primary care provider if your symptoms are not improving.          ED Prescriptions     Medication Sig Dispense Auth. Provider   ondansetron (ZOFRAN-ODT) 4 MG disintegrating tablet Take 1 tablet (4 mg total) by mouth every 8 (eight) hours as needed for nausea or vomiting. 20 tablet Mickie Bail, NP      PDMP not reviewed this encounter.   Mickie Bail, NP 04/02/22 1331

## 2022-04-02 NOTE — Discharge Instructions (Addendum)
Take the antinausea medication as directed.    Keep yourself hydrated with clear liquids, such as water and Gatorade.  Follow the diarrhea diet as tolerated.   Follow up with your primary care provider if your symptoms are not improving.      

## 2022-04-02 NOTE — ED Triage Notes (Addendum)
Pt presents with HA, dizziness, diarrhea, and stomach pain x 2 days. 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 At home covid test taken yesterday was negative.

## 2022-07-26 ENCOUNTER — Ambulatory Visit
Admission: RE | Admit: 2022-07-26 | Discharge: 2022-07-26 | Disposition: A | Discharge status: Home / Self Care | Payer: BC Managed Care – PPO | Source: Ambulatory Visit | Attending: Pediatrics | Admitting: Pediatrics

## 2022-07-26 VITALS — BP 103/65 | HR 55 | Temp 98.2°F | Resp 18

## 2022-07-26 DIAGNOSIS — J111 Influenza due to unidentified influenza virus with other respiratory manifestations: Secondary | ICD-10-CM | POA: Diagnosis not present

## 2022-07-26 NOTE — ED Provider Notes (Signed)
[facility]    CSN: [id number] Arrival date & time: 07/26/22  1259      History   Chief Complaint Chief Complaint  Patient presents with   Influenza    Covid test negative on 07/24/22 - Entered by patient   Generalized Body Aches   Cough   Headache   Emesis    HPI Christopher Myers is a 22 y.o. male who presents for evaluation of URI symptoms for 5 days. Patient reports associated symptoms of cough, congestion, sore throat, nausea/vomiting(which has now stopped), chills, body aches. Denies N/V/D, ears, shortness of breath. Patient does not have a hx of asthma or smoking. No known sick contacts and no recent travel. Pt is  vaccinated for COVID. Pt is not vaccinated for flu this season. Pt has taken DayQuil and NyQuil OTC for symptoms.  Reports negative home COVID test 2 days ago. pt has no other concerns at this time.    Influenza Presenting symptoms: cough, myalgias, nausea, sore throat and vomiting   Associated symptoms: chills and nasal congestion   Cough Associated symptoms: chills, myalgias and sore throat   Headache Associated symptoms: congestion, cough, myalgias, nausea, sore throat and vomiting   Emesis Associated symptoms: chills, cough, myalgias and sore throat     Past Medical History:  Diagnosis Date   Abdominal migraine    ADHD (attention deficit hyperactivity disorder)    Allergy    Cyclic vomiting syndrome     There are no problems to display for this patient.   Past Surgical History:  Procedure Laterality Date   FOREARM FRACTURE SURGERY Right 11/2015   HARDWARE REMOVAL Right 07/23/2016   Procedure: RIGHT FOREARM REMOVAL OF DEEP IMPLANTS RADIUS AND ULNAR;  Surgeon: Iran Planas, MD;  Location: [facility];  Service: Orthopedics;  Laterality: Right;       Home Medications    Prior to Admission medications   Medication Sig Start Date End Date Taking? Authorizing Provider  benzonatate (TESSALON) 100 MG capsule Take 1 capsule (100 mg total)  by mouth every 8 (eight) hours. 07/07/21   Hughie Closs, PA-C  cholecalciferol (VITAMIN D) 1000 units tablet Take 1,000 Units by mouth daily.    [provider]  Coenzyme Q10 (COQ10) 200 MG CAPS Take 200 mg by mouth daily.    [provider]  Dexmethylphenidate HCl 30 MG CP24 Take 30 mg by mouth daily.  05/11/16   [provider]  ipratropium (ATROVENT) 0.06 % nasal spray Place 1 spray into both nostrils daily. 06/30/16   [provider]  levocetirizine (XYZAL) 5 MG tablet Take 5 mg by mouth every evening.  05/12/16   [provider]  Methylphenidate HCl (CONCERTA PO) Take 30 mg by mouth.    [provider]  montelukast (SINGULAIR) 10 MG tablet Take 10 mg by mouth at bedtime.    [provider]  ondansetron (ZOFRAN-ODT) 4 MG disintegrating tablet Take 1 tablet (4 mg total) by mouth every 8 (eight) hours as needed for nausea or vomiting. 04/02/22   Sharion Balloon, NP  oseltamivir (TAMIFLU) 75 MG capsule Take 1 capsule (75 mg total) by mouth every 12 (twelve) hours. 06/03/21   Sharion Balloon, NP  OVER THE COUNTER MEDICATION Take 1 tablet by mouth 2 (two) times daily. L-Cartine    [provider]    Family History Family History  Problem Relation Age of Onset   Migraines Mother     Social History Social History  Tobacco Use   Smoking status: Never   Smokeless tobacco: Never  Vaping Use   Vaping Use: Never used  Substance Use Topics   Alcohol use: Never   Drug use: Never     Allergies   Fish allergy   Review of Systems Review of Systems  Constitutional:  Positive for chills.  HENT:  Positive for congestion and sore throat.   Respiratory:  Positive for cough.   Gastrointestinal:  Positive for nausea and vomiting.  Musculoskeletal:  Positive for myalgias.     Physical Exam Triage Vital Signs ED Triage Vitals  Enc Vitals Group     BP 07/26/22 1337 103/65     Pulse Rate 07/26/22 1337 (!) 55     Resp  07/26/22 1337 18     Temp 07/26/22 1337 98.2 F (36.8 C)     Temp Source 07/26/22 1337 Oral     SpO2 07/26/22 1337 96 %     Weight --      Height --      Head Circumference --      Peak Flow --      Pain Score 07/26/22 1347 7     Pain Loc --      Pain Edu? --      Excl. in GC? --    No data found.  Updated Vital Signs BP 103/65 (BP Location: Left Arm)   Pulse (!) 55   Temp 98.2 F (36.8 C) (Oral)   Resp 18   SpO2 96%   Visual Acuity Right Eye Distance:   Left Eye Distance:   Bilateral Distance:    Right Eye Near:   Left Eye Near:    Bilateral Near:     Physical Exam Vitals and nursing note reviewed.  Constitutional:      General: He is not in acute distress.    Appearance: Normal appearance. He is not ill-appearing or toxic-appearing.  HENT:     Head: Normocephalic and atraumatic.     Right Ear: Tympanic membrane and ear canal normal.     Left Ear: Tympanic membrane and ear canal normal.     Nose: Congestion present.     Mouth/Throat:     Mouth: Mucous membranes are moist.     Pharynx: Posterior oropharyngeal erythema present.  Eyes:     Pupils: Pupils are equal, round, and reactive to light.  Cardiovascular:     Rate and Rhythm: Normal rate and regular rhythm.     Heart sounds: Normal heart sounds.  Pulmonary:     Effort: Pulmonary effort is normal.     Breath sounds: Normal breath sounds.  Musculoskeletal:     Cervical back: Normal range of motion and neck supple.  Lymphadenopathy:     Cervical: No cervical adenopathy.  Skin:    General: Skin is warm and dry.  Neurological:     General: No focal deficit present.     Mental Status: He is alert and oriented to person, place, and time.  Psychiatric:        Mood and Affect: Mood normal.        Behavior: Behavior normal.      UC Treatments / Results  Labs (all labs ordered are listed, but only abnormal results are displayed) Labs Reviewed - No data to display  EKG   Radiology No results  found.  Procedures Procedures (including critical care time)  Medications Ordered in UC Medications - No data to display  Initial Impression / Assessment  and Plan / UC Course  I have reviewed the triage vital signs and the nursing notes.  Pertinent labs & imaging results that were available during my care of the patient were reviewed by me and considered in my medical decision making (see chart for details).     Reviewed exam and symptoms with patient.  No red flags on exam. Discussed symptoms consistent with an influenza-like illness but given length of symptoms treatment is symptomatic.  He was aware check-in that we are unable to check for flu given supply shortages. Patient declined Tessalon and will use OTC cough medicine as needed Rest and fluids Follow-up with PCP 2 to 3 days for recheck ER precautions reviewed and patient verbalized understanding  Final Clinical Impressions(s) / UC Diagnoses   Final diagnoses:  Influenza-like illness     Discharge Instructions      Your symptoms and exam are consistent for a viral illness. Please treat your symptoms with over the counter cough medication, tylenol or ibuprofen, humidifier, and rest. Viral illnesses can last 7-14 days. Please follow up with your PCP if your symptoms are not improving. Please go to the ER for any worsening symptoms. This includes but is not limited to fever you can not control with tylenol or ibuprofen, you are not able to stay hydrated, you have shortness of breath or chest pain.  Thank you for choosing [facility] for your healthcare needs. I hope you feel better soon!    ED Prescriptions   None    PDMP not reviewed this encounter.   Melynda Ripple, NP 07/26/22 1406

## 2022-07-26 NOTE — ED Triage Notes (Signed)
Patient presents to UC for possible flu. Tested negative for COVID 07/24/2022. States he developed body ache, sore throat, HA since last Thursday. Reports vomiting x 2 days. Taking dayuil, nyquil, and delsym.

## 2022-07-26 NOTE — Discharge Instructions (Signed)
Your symptoms and exam are consistent for a viral illness. Please treat your symptoms with over the counter cough medication, tylenol or ibuprofen, humidifier, and rest. Viral illnesses can last 7-14 days. Please follow up with your PCP if your symptoms are not improving. Please go to the ER for any worsening symptoms. This includes but is not limited to fever you can not control with tylenol or ibuprofen, you are not able to stay hydrated, you have shortness of breath or chest pain.  Thank you for choosing [facility] for your healthcare needs. I hope you feel better soon!  

## 2022-12-22 ENCOUNTER — Ambulatory Visit (INDEPENDENT_AMBULATORY_CARE_PROVIDER_SITE_OTHER): Payer: BC Managed Care – PPO | Admitting: Psychiatry

## 2022-12-22 ENCOUNTER — Encounter: Payer: Self-pay | Admitting: Psychiatry

## 2022-12-22 VITALS — BP 137/84 | HR 83 | Ht 70.0 in | Wt 167.0 lb

## 2022-12-22 DIAGNOSIS — F900 Attention-deficit hyperactivity disorder, predominantly inattentive type: Secondary | ICD-10-CM | POA: Diagnosis not present

## 2022-12-22 MED ORDER — METHYLPHENIDATE HCL ER (OSM) 27 MG PO TBCR: 27.0000 mg | EXTENDED_RELEASE_TABLET | ORAL | 0 refills | Status: DC

## 2022-12-22 NOTE — Progress Notes (Signed)
[facility] [address], [place]   New patient visit Date of Service: 12/22/2022  Referral Source:  History From: patient, chart review, parent/guardian    New Patient Appointment    Christopher Myers is a 22 y.o. male with a history significant for ADHD. Patient is currently taking the following medications:  - none _______________________________________________________________  Christopher Myers presents to clinic with his mother for his evaluation. They were interviewed together and [name] was interviewed alone.   Christopher Myers was diagnosed with ADHD at a young age, in elementary school. He was found to mostly have inattentive ADHD symptoms at that time. He would struggle with focus, get easily distracted, not pay attention in class, make careless mistakes, struggled some with forgetfulness and organization. He was on medicines then, specifically Focalin for several years. This medicine flattened him and caused him to not talk and not have as much of his typical personality. He also started having abdominal migraines so this was switched to Concerta. On this medicine he did a bit better. He took this until the pandemic started, but then stopped because he didn't need it while at home. He was less distracted at home and seemed to do okay with focusing and getting his work done. Since then he hasn't taken any medicines. He has been doing school at [employer], and doing well with mostly online classes. He still struggles in some subjects. He is going to [employer] this fall, and is looking forward to this. He and his mother are concerned about his ADHD and want to be sure he has the best chance for success there. They deny any SI/HI/Avh.  Christopher denies any consistent symptoms of depression, anxiety, psychosis, mania, etc. He denies any substance use. No SI/Hi/AVH.     Current suicidal/homicidal ideations: denied Current auditory/visual hallucinations: denied Sleep:  stable Appetite: Stable Depression: denies Bipolar symptoms: denies ASD: denies Encopresis/Enuresis: denies Tic: denies Generalized Anxiety Disorder: denies Other anxiety: denies Obsessions and Compulsions: denies Trauma/Abuse: denies ADHD: see HPI  Review of Systems  All other systems reviewed and are negative.      Current Outpatient Medications:    methylphenidate 27 MG PO CR tablet, Take 1 tablet (27 mg total) by mouth every morning., Disp: 30 tablet, Rfl: 0   ondansetron (ZOFRAN-ODT) 4 MG disintegrating tablet, Take 1 tablet (4 mg total) by mouth every 8 (eight) hours as needed for nausea or vomiting., Disp: 20 tablet, Rfl: 0   Allergies  Allergen Reactions   Fish Allergy Other (See Comments)    unknown      Psychiatric History: Previous diagnoses/symptoms: ADHD Non-Suicidal Self-Injury: denies Suicide Attempt History: denies Violence History: denies  Current psychiatric provider: denies Psychotherapy:  currently with Lillia Abed at [facility] in [place] Previous psychiatric medication trials:  Focalin, Concerta, Vyvanse Psychiatric hospitalizations: denies History of trauma/abuse: denies    Past Medical History:  Diagnosis Date   Abdominal migraine    ADHD (attention deficit hyperactivity disorder)    Allergy    Cyclic vomiting syndrome     History of head trauma? No History of seizures?  No     Substance use reviewed with pt, with pertinent items below: denies  History of substance/alcohol abuse treatment: n/a     Family psychiatric history: denies   Family history of suicide? 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   Blood pressure 137/84, pulse 83, height 5\' 10"  (1.778 m), weight 167 lb (75.8 kg).Body mass index is 23.96 kg/m.  General Appearance: Neat and Well Groomed  Eye Contact:  Good  Speech:  Clear and Coherent and Normal Rate  Mood:  Euthymic  Affect:  Appropriate  Thought Process:  Goal Directed  Orientation:  Full (Time, Place, and Person)  Thought Content:  Logical  Suicidal Thoughts:  No  Homicidal Thoughts:  No  Memory:  Immediate;   Good  Judgement:  Good  Insight:  Good  Psychomotor Activity:  Normal   Concentration:  Concentration: Good  Recall:  Good  Fund of Knowledge:  Good  Language:  Good  Cognition:  WNL     Assessment   Psychiatric Diagnoses:   ICD-10-CM   1. Attention deficit hyperactivity disorder (ADHD), predominantly inattentive type  F90.0        Medical Diagnoses: There are no problems to display for this patient.    Medical Decision Making: Moderate  Ladell B Myers is a 22 y.o. male with a history detailed above.   On evaluation [name] has symptoms consistent with ADHD. This was first diagnosed when he was in elementary school based on testing then. He had symptoms that were inattentive in nature. He was on medicines for several years with some benefit but often several side effects. Currently he has some remaining symptoms of ADHD, including forgetfulness, careless errors, trouble with focus, getting distracted. These aren't as big of an issue as they were in the past, but do remain to a fair degree. We will plan on retrying a stimulant to help with his ADHD as needed.  He denies symptoms of depression, anxiety, psychosis, mania, etc. No SI/HI/AVH.  There are no identified acute safety concerns. Continue outpatient level of care.     Plan  Medication management:  - Start Concerta 27mg  daily for ADHD  Labs/Studies:  - reviewed  Additional recommendations:  -  Continue with current therapist, Crisis plan reviewed and patient verbally contracts for safety. Go to ED with emergent symptoms or safety concerns, and Risks, benefits, side effects of medications, including any / all black box warnings, discussed with patient, who verbalizes their understanding   Follow Up: Return in 1 month - Call in the interim for any side-effects, decompensation, questions, or problems between now and the next visit.   I have spend 65 minutes reviewing the patients chart, meeting with the patient and family, and reviewing medications and potential side effects for their condition of ADHD.  [name], MD [facility]

## 2023-01-23 ENCOUNTER — Encounter: Payer: Self-pay | Admitting: Psychiatry

## 2023-01-23 ENCOUNTER — Ambulatory Visit (INDEPENDENT_AMBULATORY_CARE_PROVIDER_SITE_OTHER): Payer: BC Managed Care – PPO | Admitting: Psychiatry

## 2023-01-23 DIAGNOSIS — F900 Attention-deficit hyperactivity disorder, predominantly inattentive type: Secondary | ICD-10-CM | POA: Diagnosis not present

## 2023-01-23 MED ORDER — AZSTARYS 26.1-5.2 MG PO CAPS: 1.0000 | ORAL_CAPSULE | Freq: Every day | ORAL | 0 refills | Status: DC

## 2023-01-23 NOTE — Progress Notes (Signed)
[facility] [address], [place]   Follow-up visit  Date of Service: 01/23/2023  CC/Purpose: Routine medication management follow up.    Christopher Myers is a 22 y.o. male with a past psychiatric history of ADHD who presents today for a psychiatric follow up appointment.    The patient was last seen on 12/22/22, at which time the following plan was established: Medication management:             - Start Concerta 27mg  daily for ADHD _______________________________________________________________________________________ Acute events/encounters since last visit: none    Christopher Myers presents to clinic alone. He reports that he has been unable to pick up Concerta. The pharmacy didn't have it available. Discussed his ADHD - these symptoms remain persistent, he still struggles with focus, organization, forgetfulness, etc. He denies any issues with anxiety or mood. Discussed stimulant options, he is okay with trying a new stimulant. No SI/HI/AVH.    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   Psychiatric Specialty Exam: There were no vitals taken for this visit.There is no height or weight on file to calculate BMI.  General Appearance: Neat and Well Groomed  Eye Contact:   Good  Speech:  Clear and Coherent and Normal Rate  Mood:  Euthymic  Affect:  Appropriate and Congruent  Thought Process:  Goal Directed  Orientation:  Full (Time, Place, and Person)  Thought Content:  Logical  Suicidal Thoughts:  No  Homicidal Thoughts:  No  Memory:  Immediate;   Good  Judgement:  Good  Insight:  Good  Psychomotor Activity:  Normal  Concentration:  Concentration: Good  Recall:  Good  Fund of Knowledge:  Good  Language:  Good  Assets:  Communication Skills Desire for Improvement Financial Resources/Insurance Housing Leisure Time Physical Health Resilience Social Support Talents/Skills Transportation Vocational/Educational  Cognition:  WNL      Assessment   Psychiatric Diagnoses:   ICD-10-CM   1. Attention deficit hyperactivity disorder (ADHD), predominantly inattentive type  F90.0       Patient complexity: Moderate   Patient Education and Counseling:  Supportive therapy provided for identified psychosocial stressors.  Medication education provided and decisions regarding medication regimen discussed with patient/guardian.   On assessment today, Christopher Myers has been unable to get the Concerta. He continues to have ADHD symptoms, which have been a lifelong issue. We will try to prescribe another stimulant that should be more available. No anxiety or depression reported. Going to [employer] this fall. No SI/HI/AVH.    Plan  Medication management:  - Unable to get Concerta  - Start Azstarys 26.1-5.2mg  daily for ADHD  Labs/Studies:  - none today  Additional recommendations:  - Crisis plan reviewed and patient verbally contracts for safety. Go to ED with emergent symptoms or safety concerns and Risks, benefits, side effects of medications,  including any / all black box warnings, discussed with patient, who verbalizes their understanding  - Wrote a letter for accommodations at [employer]  - Continue with current therapist   Follow Up: Return in 1 month -  Call in the interim for any side-effects, decompensation, questions, or problems between now and the next visit.   I have spent 25 minutes reviewing the patients chart, meeting with the patient and family, and reviewing medicines and side effects.   Christopher Hymen, MD [facility]

## 2023-02-22 ENCOUNTER — Encounter: Payer: Self-pay | Admitting: Psychiatry

## 2023-02-22 ENCOUNTER — Ambulatory Visit (INDEPENDENT_AMBULATORY_CARE_PROVIDER_SITE_OTHER): Payer: BC Managed Care – PPO | Admitting: Psychiatry

## 2023-02-22 DIAGNOSIS — F900 Attention-deficit hyperactivity disorder, predominantly inattentive type: Secondary | ICD-10-CM

## 2023-02-22 NOTE — Progress Notes (Signed)
[facility] [address], [place]   Follow-up visit  Date of Service: 02/22/2023  CC/Purpose: Routine medication management follow up.    Christopher Myers is a 22 y.o. male with a past psychiatric history of ADHD who presents today for a psychiatric follow up appointment.    The patient was last seen on 01/23/23, at which time the following plan was established: Medication management:             - Unable to get Concerta             - Start Azstarys 26.1-5.2mg  daily for ADHD _______________________________________________________________________________________ Acute events/encounters since last visit: none    Tyheem presents to clinic alone. He states that he was able to get both Azstarys and Concerta. He felt the Azstarys caused some headache and he didn't like how it made him feel. He took the Concerta once and felt it was pretty good. He plans on trying this again next week and will plan on taking  this at school. No issues with anxiety or depression at this time. No SI/HI/AVH.    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   Psychiatric Specialty Exam: There were no vitals taken for this visit.There is no height or weight on file to calculate BMI.  General  Appearance: Neat and Well Groomed  Eye Contact:  Good  Speech:  Clear and Coherent and Normal Rate  Mood:  Euthymic  Affect:  Appropriate and Congruent  Thought Process:  Goal Directed  Orientation:  Full (Time, Place, and Person)  Thought Content:  Logical  Suicidal Thoughts:  No  Homicidal Thoughts:  No  Memory:  Immediate;   Good  Judgement:  Good  Insight:  Good  Psychomotor Activity:  Normal  Concentration:  Concentration: Good  Recall:  Good  Fund of Knowledge:  Good  Language:  Good  Assets:  Communication Skills Desire for Improvement Financial Resources/Insurance Housing Leisure Time Physical Health Resilience Social Support Talents/Skills Transportation Vocational/Educational  Cognition:  WNL      Assessment   Psychiatric Diagnoses:   ICD-10-CM   1. Attention deficit hyperactivity disorder (ADHD), predominantly inattentive type  F90.0        Patient complexity: Moderate   Patient Education and Counseling:  Supportive therapy provided for identified psychosocial stressors.  Medication education provided and decisions regarding medication regimen discussed with patient/guardian.   On assessment today, Roger did not tolerate Azstarys, but has been able to get Concerta. He has taken this once with positive benefit. We will continue with this plan as he goes to college. No mood or anxiety issues. No SI/HI/AVH.    Plan  Medication management:  - Switch to Concerta 27mg  daily for ADHD  Labs/Studies:  - none today  Additional recommendations:  - Crisis plan reviewed and patient verbally  contracts for safety. Go to ED with emergent symptoms or safety concerns and Risks, benefits, side effects of medications, including any / all black box warnings, discussed with patient, who verbalizes their understanding  - Wrote a letter for accommodations at [employer]  - Continue with current therapist   Follow Up: Return in 1 month - Call in the interim for any  side-effects, decompensation, questions, or problems between now and the next visit.   I have spent 25 minutes reviewing the patients chart, meeting with the patient and family, and reviewing medicines and side effects.   [name], MD [facility]

## 2023-04-06 ENCOUNTER — Telehealth (INDEPENDENT_AMBULATORY_CARE_PROVIDER_SITE_OTHER): Payer: Self-pay | Admitting: Psychiatry

## 2023-04-06 DIAGNOSIS — F900 Attention-deficit hyperactivity disorder, predominantly inattentive type: Secondary | ICD-10-CM

## 2023-04-07 NOTE — Progress Notes (Signed)
No show
